# Patient Record
Sex: Male | Born: 1960 | Hispanic: Yes | Marital: Married
Health system: Southern US, Community
[De-identification: ages and names within clinical notes are randomized; demographics above are authoritative.]

---

## 2004-12-30 ENCOUNTER — Emergency Department: Payer: Self-pay | Admitting: Emergency Medicine

## 2006-08-13 ENCOUNTER — Ambulatory Visit: Payer: Self-pay | Admitting: Urology

## 2006-11-03 ENCOUNTER — Ambulatory Visit: Payer: Self-pay | Admitting: Urology

## 2007-10-06 ENCOUNTER — Ambulatory Visit: Payer: Self-pay | Admitting: Podiatry

## 2008-09-15 ENCOUNTER — Emergency Department: Payer: Self-pay | Admitting: Emergency Medicine

## 2008-09-29 ENCOUNTER — Emergency Department: Payer: Self-pay | Admitting: Emergency Medicine

## 2009-11-13 ENCOUNTER — Emergency Department: Payer: Self-pay | Admitting: Unknown Physician Specialty

## 2009-12-11 ENCOUNTER — Emergency Department: Payer: Self-pay | Admitting: Internal Medicine

## 2009-12-13 ENCOUNTER — Emergency Department: Payer: Self-pay | Admitting: Emergency Medicine

## 2011-07-17 ENCOUNTER — Ambulatory Visit: Payer: Self-pay | Admitting: Family Medicine

## 2012-07-10 LAB — COMPREHENSIVE METABOLIC PANEL
Albumin: 4 g/dL (ref 3.4–5.0)
Alkaline Phosphatase: 89 U/L (ref 50–136)
Anion Gap: 8 (ref 7–16)
BUN: 23 mg/dL — ABNORMAL HIGH (ref 7–18)
Bilirubin,Total: 0.8 mg/dL (ref 0.2–1.0)
Chloride: 101 mmol/L (ref 98–107)
Co2: 27 mmol/L (ref 21–32)
Creatinine: 1.01 mg/dL (ref 0.60–1.30)
EGFR (African American): >60
Glucose: 145 mg/dL — ABNORMAL HIGH (ref 65–99)
Potassium: 3.9 mmol/L (ref 3.5–5.1)

## 2012-07-10 LAB — CBC
HCT: 46 % (ref 40.0–52.0)
MCHC: 33.9 g/dL (ref 32.0–36.0)
MCV: 91 fL (ref 80–100)
Platelet: 362 10*3/uL (ref 150–440)
RBC: 5.03 10*6/uL (ref 4.40–5.90)
RDW: 13.9 % (ref 11.5–14.5)
WBC: 17.9 10*3/uL — ABNORMAL HIGH (ref 3.8–10.6)

## 2012-07-11 LAB — CBC WITH DIFFERENTIAL/PLATELET
Basophil #: 0 10*3/uL (ref 0.0–0.1)
Eosinophil %: 0.5 %
Lymphocyte #: 3.2 10*3/uL (ref 1.0–3.6)
Lymphocyte %: 20.3 %
MCH: 31.1 pg (ref 26.0–34.0)
MCHC: 34 g/dL (ref 32.0–36.0)
MCV: 91 fL (ref 80–100)
Monocyte #: 1.4 x10 3/mm — ABNORMAL HIGH (ref 0.2–1.0)
Neutrophil #: 11.1 10*3/uL — ABNORMAL HIGH (ref 1.4–6.5)
Platelet: 331 10*3/uL (ref 150–440)
RDW: 13.7 % (ref 11.5–14.5)
WBC: 15.9 10*3/uL — ABNORMAL HIGH (ref 3.8–10.6)

## 2012-07-12 ENCOUNTER — Inpatient Hospital Stay: Payer: Self-pay | Admitting: Internal Medicine

## 2012-07-13 LAB — BASIC METABOLIC PANEL
Anion Gap: 7 (ref 7–16)
Calcium, Total: 8.8 mg/dL (ref 8.5–10.1)
Co2: 27 mmol/L (ref 21–32)
Creatinine: 1.1 mg/dL (ref 0.60–1.30)
Glucose: 112 mg/dL — ABNORMAL HIGH (ref 65–99)
Potassium: 4 mmol/L (ref 3.5–5.1)
Sodium: 136 mmol/L (ref 136–145)

## 2012-07-13 LAB — CBC WITH DIFFERENTIAL/PLATELET
Eosinophil #: 0.2 10*3/uL (ref 0.0–0.7)
HCT: 43 % (ref 40.0–52.0)
HGB: 14.5 g/dL (ref 13.0–18.0)
MCH: 31.1 pg (ref 26.0–34.0)
MCHC: 33.8 g/dL (ref 32.0–36.0)
MCV: 92 fL (ref 80–100)
Monocyte %: 10.9 %
Neutrophil #: 6.2 10*3/uL (ref 1.4–6.5)
Neutrophil %: 55 %
RBC: 4.67 10*6/uL (ref 4.40–5.90)
RDW: 13.5 % (ref 11.5–14.5)
WBC: 11.2 10*3/uL — ABNORMAL HIGH (ref 3.8–10.6)

## 2012-07-13 LAB — VANCOMYCIN, TROUGH: Vancomycin, Trough: 8 ug/mL — ABNORMAL LOW (ref 10–20)

## 2012-07-14 LAB — CBC WITH DIFFERENTIAL/PLATELET
Basophil %: 0.3 %
Eosinophil %: 1.2 %
Lymphocyte #: 2.8 10*3/uL (ref 1.0–3.6)
Lymphocyte %: 24.7 %
MCH: 31 pg (ref 26.0–34.0)
MCHC: 33.7 g/dL (ref 32.0–36.0)
Monocyte %: 8 %
RBC: 4.39 10*6/uL — ABNORMAL LOW (ref 4.40–5.90)
RDW: 13.7 % (ref 11.5–14.5)
WBC: 11.3 10*3/uL — ABNORMAL HIGH (ref 3.8–10.6)

## 2012-07-14 LAB — VANCOMYCIN, TROUGH: Vancomycin, Trough: 10 ug/mL (ref 10–20)

## 2012-07-14 LAB — BASIC METABOLIC PANEL
Anion Gap: 6 — ABNORMAL LOW (ref 7–16)
Calcium, Total: 8.4 mg/dL — ABNORMAL LOW (ref 8.5–10.1)
Co2: 28 mmol/L (ref 21–32)
Creatinine: 0.99 mg/dL (ref 0.60–1.30)
EGFR (African American): >60
Osmolality: 275 (ref 275–301)
Potassium: 4 mmol/L (ref 3.5–5.1)

## 2012-07-15 LAB — CBC WITH DIFFERENTIAL/PLATELET
Basophil #: 0 10*3/uL (ref 0.0–0.1)
Basophil %: 0.3 %
Eosinophil #: 0.2 10*3/uL (ref 0.0–0.7)
Eosinophil %: 1.6 %
HCT: 41.5 % (ref 40.0–52.0)
HGB: 14.1 g/dL (ref 13.0–18.0)
MCHC: 33.9 g/dL (ref 32.0–36.0)
MCV: 93 fL (ref 80–100)
Monocyte #: 1.3 x10 3/mm — ABNORMAL HIGH (ref 0.2–1.0)
Monocyte %: 11.3 %
Neutrophil #: 6.4 10*3/uL (ref 1.4–6.5)
Neutrophil %: 56 %
RDW: 13.7 % (ref 11.5–14.5)

## 2012-07-16 LAB — CULTURE, BLOOD (SINGLE)

## 2014-07-27 NOTE — H&P (Signed)
   Subjective/Chief Complaint Right knee pain, swelling, erythema x 4 days   History of Present Illness [name] is a pleasant 54 yo M with a history of diabetes, HTN and hypercholesterolemia who presents with 4 days of worsening right knee pain/redness/erythema/crepitace.  His pain began suddenly last Thursday.  Cannot remember any specific trauma.  Began as a small area and then expanded and became more tender.  Subjective fevers/chills at admission.  Difficulty with extension of knee.  Otherwise doing well.   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[employer]   Review of Systems:  Subjective/Chief Complaint Right knee pain/redness/drainage   Fever/Chills Yes   Cough No   Sputum No   Abdominal Pain No   Diarrhea No   Constipation No   Nausea/Vomiting No   SOB/DOE No   Chest Pain No   Dysuria No   Physical Exam:  GEN well developed, well nourished, no acute distress   HEENT pink conjunctivae, PERRL, hearing intact to voice, moist oral mucosa   NECK supple  No masses   RESP normal resp effort  clear BS  no use of accessory muscles   CARD regular rate  no murmur  no thrills  No LE edema  no JVD   ABD denies tenderness  denies Flank Tenderness  no hernia  soft  normal BS  no Abdominal Bruits   LYMPH negative neck, negative axillae   EXTR negative cyanosis/clubbing, negative edema, + right leg erythema/induration/crepitance   SKIN No rashes, No ulcers   NEURO cranial nerves intact, negative rigidity, negative tremor, follows commands   PSYCH alert, A+O to time, place, person, good insight     Assessment/Admission Diagnosis [name] is a pleasant 54 yo M with Right knee cellulitis/induration and crepitace. Concern for severe necrotizing soft tissue infection.   Plan Will obtain CT leg to determine extent of subq air.  Will make NPO.  Will broaded with Zosyn for anaerobes/gram -.  Will plan on OR later tonight.   Electronic Signatures: Jarvis NewcomerLundquist, Javona Bergevin A (MD)  (Signed 07-Apr-14 15:25)  Authored: CHIEF COMPLAINT and HISTORY, PAST MEDICAL/SURGIAL HISTORY, ALLERGIES, FAMILY AND SOCIAL HISTORY, REVIEW OF SYSTEMS, PHYSICAL EXAM, ASSESSMENT AND PLAN   Last Updated: 07-Apr-14 15:25 by Jarvis NewcomerLundquist, Kirandeep Fariss A (MD)

## 2014-07-27 NOTE — Discharge Summary (Signed)
PATIENT NAME:  Edward Kirk, [place] M MR#:  [id number] DATE OF BIRTH:  [birth year]  DATE OF ADMISSION:  07/12/2012 DATE OF DISCHARGE:  07/15/2012  PRIMARY CARE PHYSICIAN:  [facility].  SURGEON:  Ida Roguehristopher Lundquist, M.D.   FINAL DIAGNOSES: 1.  Staph aureus abscess and cellulitis of the knee.  2.  Diabetes, diet controlled.  3.  Hypertension.  4.  Hyperlipidemia.   MEDICATIONS ON DISCHARGE: Aspirin 81 mg daily, lisinopril 20 mg daily, lovastatin 20 mg daily, Colace 100 mg twice a day, Cipro 500 mg every 12 hour for 10 more days, oxycodone 5 mg 1 tablet every 4 hours as needed for pain.  WOUND CARE:  Keep wound covered at all times and dry. Dry dressing daily. Follow up with Dr. Juliann PulseLundquist, surgery next week to remove the Penrose drain.   DIET: Low sodium, carbohydrate-controlled diet, regular consistency.   ACTIVITY: As tolerated.   FOLLOW UP:  Follow-up with [facility] April 23.   REASON FOR ADMISSION: The patient was admitted 07/10/2012 and discharged 07/15/2012. The patient was admitted with a right knee cellulitis that  started as a small spot and expanded with redness and tenderness, difficulty moving the leg.  He was admitted with cellulitis of the right knee and induration.  The patient was initially admitted as an observation and then started on IV vancomycin.  LABORATORY AND DIAGNOSTIC DAT DURING HOSPITAL COURSE: Included a white blood cell count is 17.9, hemoglobin and hematocrit 15.6 and 40.6, platelet count of 362, glucose 145, BUN 23, creatinine 1.01, sodium 136, potassium 3.9, chloride 101, CO2 27, calcium 9.0. Blood cultures negative. Ultrasound of the right lower extremity:  No deep vein thrombosis, prominent lymph nodes right groin.  CT tibia of the right leg showed subcutaneous edema. No abscess. CT femur of the thigh showed diffuse subcutaneous edema, no focal bony abnormality. April 9 wound culture grew out staph aureus resistant to clindamycin and  erythromycin but sensitive to everything else. White count upon discharge 11.4. The patient was taken to the operating room by Dr. Juliann PulseLundquist on 07/13/2012.  Please see operative report.  HOSPITAL COURSE PER PROBLEM LIST: 1.  For the patient's staph aureus abscess and cellulitis of the knee, the patient was initially started on vancomycin and Zosyn was added.  He was very slow to improve. The patient had an elevated white count and was taken to the operating room by Dr. Juliann PulseLundquist on 04/09. Penrose drain was placed. The patient was kept on the vancomycin up until discharge and was switched to Cipro on the day of discharge since the staph aureus is sensitive.  We will continue the Cipro for another 10 days upon discharge.  The patient was given a walker.  The patient was given out of work until seen by surgery as outpatient. He was given pain medication.  2.  Diabetes. I think this can be diet-controlled.  Sugars were borderline during the hospital course.  3.  Hypertension. Blood pressure is stable on lisinopril.  4.  Hyperlipidemia: The patient is on lovastatin, no changes in medications.  5.  For the patient's constipation, the patient did have a bowel movement here. I will prescribe Colace while the patient is on pain medication.  Time spent on discharge: 35 minutes.     ____________________________ Herschell Dimesichard J. Renae GlossWieting, MD rjw:ct D: 07/15/2012 17:53:05 ET T: 07/16/2012 13:43:00 ET JOB#: [id number]  cc: Herschell Dimesichard J. Renae GlossWieting, MD, <Dictator> Christopher A. Lundquist, MD [facility] Salley ScarletICHARD J Nicci Vaughan MD ELECTRONICALLY SIGNED 07/16/2012  19:23 

## 2014-07-27 NOTE — H&P (Signed)
PATIENT NAME:  Edward Kirk, Edward Kirk MR#:  [id number] DATE OF BIRTH:  1960/11/27  DATE OF ADMISSION:  07/11/2012  PRIMARY CARE PHYSICIAN: Dr. Sheppard Penton.   REFERRING PHYSICIAN: Si Raider.   CHIEF COMPLAINT: Right leg cellulitis.   HISTORY OF PRESENT ILLNESS: The patient is a 54 year old Hispanic male with history of diabetes mellitus, hypertension and hypercholesterolemia. He was in his usual state of health until about Thursday, that is 4 days ago, when he developed a small red spot on the right knee that with time expanded to have redness and swelling and tenderness. There is central ulcer on that spot. The patient denies having any trauma to this area or any form of bite. With time, the area expanded and got worse and tender. The patient reports a mild feverish feeling and some chills.   REVIEW OF SYSTEMS:   CONSTITUTIONAL: Mild feverish feeling and little chills. No fatigue.  EYES: No blurring of vision. No double vision.  ENT: No hearing impairment. No sore throat. No dysphagia.  CARDIOVASCULAR: No chest pain. No shortness of breath. No syncope.  RESPIRATORY: No cough. No sputum production. No shortness of breath.  GASTROINTESTINAL: No abdominal pain, no vomiting, no diarrhea.  GENITOURINARY: No dysuria. No frequency of urination.  MUSCULOSKELETAL: No joint pain other than the tenderness in the right knee. No muscular tenderness or pain or swelling other than the tenderness and the induration in the right leg.  INTEGUMENTARY: No skin rash or ulcers other than the erythema of the cellulitis and the central punctum.   NEUROLOGY: No focal weakness. No seizure activity. No headache.  PSYCHIATRY: Denies anxiety. No depression.  ENDOCRINE: No polyuria or polydipsia. No heat or cold intolerance.   PAST MEDICAL HISTORY: Systemic hypertension, diabetes mellitus type 2, hypercholesterolemia.   PAST SURGICAL HISTORY: None.   SOCIAL HABITS: He smokes cigarettes every now and then but not  daily. He drinks 1 beer a day. No liquor. No other drug abuse.   SOCIAL HISTORY: He is married, perhaps he is separated but he does not give confirmatory statement. At one time, he said separated. The other time he said no, he is still married. He works in Environmental manager.   FAMILY HISTORY: His parents both died of old age around 56 years old. No significant medical illness.   ADMISSION MEDICATIONS: He does not recall the name of his medications with the exception of lovastatin.   ALLERGIES: No known drug allergies.   PHYSICAL EXAMINATION:  VITAL SIGNS: Blood pressure 137/80, respiratory rate 18, pulse 110, temperature 98.6. His oxygen saturation is 96% on room air.  GENERAL APPEARANCE: He is a middle-aged male lying in bed in no acute distress.  HEAD AND NECK: No pallor. No icterus. No cyanosis. Ear examination revealed normal hearing, no discharge, no lesions. Examination of the nose showed no discharge, no bleeding, no ulcers. Examination of the oropharyngeal area showed no oral thrush, no exudates, no ulcers. Eye examination revealed normal eyelids and conjunctivae. Pupils are constricted. They are equal in size. Neck is supple. Trachea at midline. No thyromegaly. No cervical lymphadenopathy. No masses.  HEART: Normal S1, S2. No S3, S4. No murmur. No gallop. No carotid bruits.  RESPIRATORY: Normal breathing pattern without use of accessory muscles. No rales. No wheezing.  ABDOMEN: Soft without tenderness. No hepatosplenomegaly. No masses. No hernias.  SKIN: Indurated area with redness around the right knee joint measured 7 x 4 cm. There is extension of induration just above that area but without redness. He  is tender in that area. No calf tenderness or palpable cords.  MUSCULOSKELETAL: No joint swelling. No clubbing.  NEUROLOGIC: Cranial nerves II through XII were intact. No focal motor deficit.  PSYCHIATRIC: The patient is alert and oriented x3. Mood and affect were normal.    LABORATORY FINDINGS: CBC showed elevated white count reaching 17,900, hemoglobin 15, hematocrit 46, platelet count 362. Normal liver function tests and transaminases. Serum glucose 145, BUN 23, creatinine 1.01, sodium 136, potassium 3.9, calcium 9.   ASSESSMENT:  1. Cellulitis of the right knee with induration extending above that level.  2. Diabetes mellitus, type 2.  3. Systemic hypertension.  4. Hypercholesterolemia.   PLAN: Will admit the patient for observation. Blood cultures x2 were already taken in the Emergency Department, and he was started on IV vancomycin. I will continue same antibiotic. Will monitor the area of cellulitis and check the boundaries tomorrow. I will obtain Doppler ultrasound just to make sure there is no underlying DVT. Pain control. I will put him on Accu-Chek and sliding scale until he brings his medications from home or somebody will call for the names.   Time spent in evaluating this patient took more than 50 minutes.    ____________________________ Carney CornersAmir Kirk. Rudene Rearwish, MD amd:gb D: 07/11/2012 00:07:56 ET T: 07/11/2012 02:08:49 ET JOB#: [id number]  cc: Carney CornersAmir Kirk. Rudene Rearwish, MD, <Dictator> Zollie ScaleAMIR Kirk Mialani Reicks MD ELECTRONICALLY SIGNED 07/20/2012 5:06

## 2014-07-27 NOTE — Op Note (Signed)
PATIENT NAME:  Edward Kirk, Edward Kirk MR#:  [id number] DATE OF BIRTH:  1961/01/26  DATE OF PROCEDURE:  07/13/2012  PREOPERATIVE DIAGNOSIS: Right knee abscess.  POSTOPERATIVE DIAGNOSIS:  Right knee abscess.  PROCEDURE:  Incision and drainage of right knee abscess with placement of Penrose drain.   SURGEON:  Ida Roguehristopher Peter Daquila, MD  ESTIMATED BLOOD LOSS: 10 mL.  ANESTHESIA:  MAC local.  COMPLICATIONS:  None.  SPECIMEN: Culture swab from wound.   INDICATION FOR SURGERY: [name] is a pleasant 54 year old male with a history of right knee pain and swelling.  He had a CT which did not show any fluid collection, however, his wound came to a head and he was in need of incision and drainage of right knee abscess.   DETAILS OF PROCEDURE: Informed consent was obtained. He was brought to the operating room suite. He was laid supine on the operating room table. He was given sedation. His right knee was prepped and draped in standard surgical fashion.  A timeout was then performed correctly identifying the patient name, operative site and procedure to be performed.  An incision was made over the wound. It went deep. There was purulence that tracked up into his thigh, approximately 8 cm. I then opened up those areas and made sure there were not any loculations. I then made a counterincision on the thigh and placed a Penrose drain through the tract. I then irrigated the wound. It was hemostatic. I then sutured the Penrose drain at both entrance and exit sites with a 3-0 nylon suture. ABD pad and Kerlix gauze was then wrapped around the leg. The     patient was then awakened and brought to the postanesthesia care unit. There were no immediate complications. Needle, sponge, and instrument counts were correct at the end of the procedure.  ____________________________ Si Raiderhristopher A. Tristian Sickinger, MD cal:sb D: 07/13/2012 14:08:08 ET T: 07/13/2012 14:34:16 ET JOB#: [id number]  cc: Cristal Deerhristopher A.  Salihah Peckham, MD, <Dictator> Jarvis NewcomerHRISTOPHER A Harding Thomure MD ELECTRONICALLY SIGNED 07/13/2012 16:23

## 2014-10-01 IMAGING — CT CT OF THE RIGHT TIBIA FIBULA WITHOUT CONTRAST
1 series · 12 of 14 positions shown, 15 images · non-contrast
Comparison: none

REASON FOR EXAM: right knee crepitice
COMMENTS:

[Series 3: axial · axial · 0.36mm/px · z∈[-332,+32]mm · 12 of 143 slices shown, 15 images]
[im 11/143  soft-tissue]
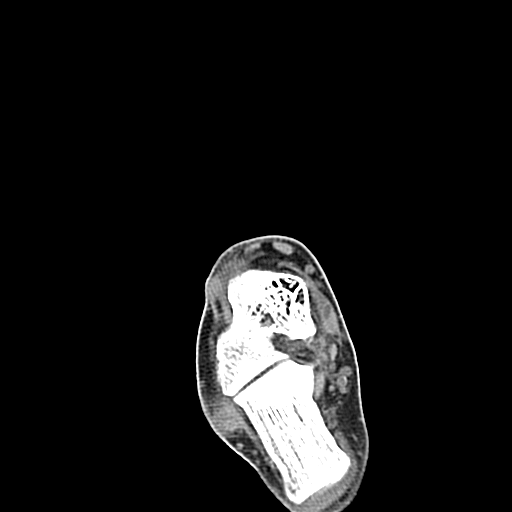
[im 11/143  bone]
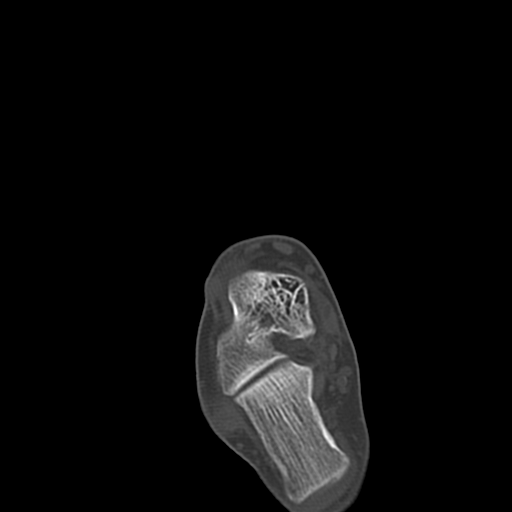
[im 22/143  bone]
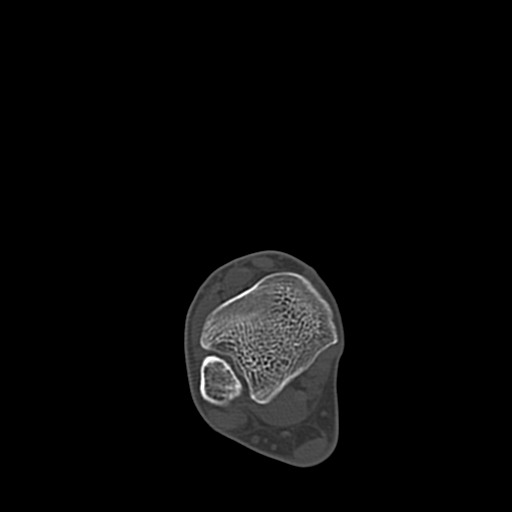
[im 33/143  bone]
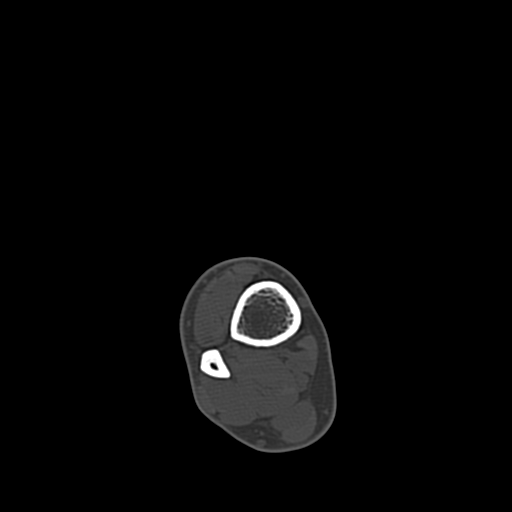
[im 44/143  bone]
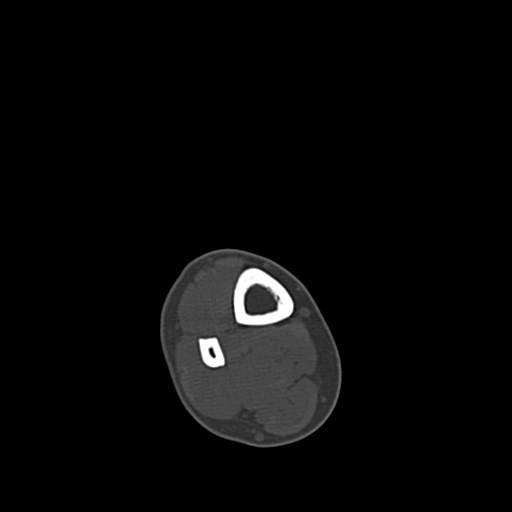
[im 55/143  soft-tissue]
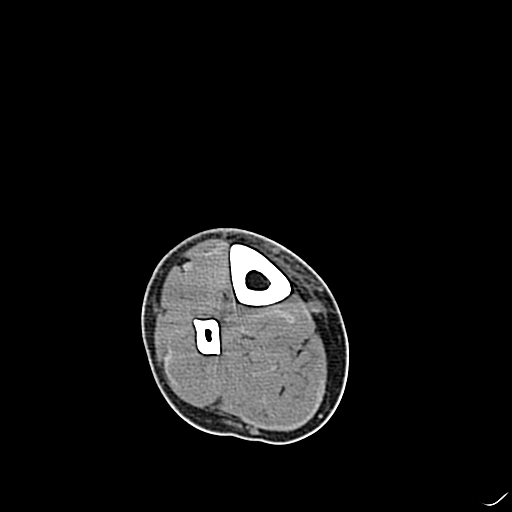
[im 55/143  bone]
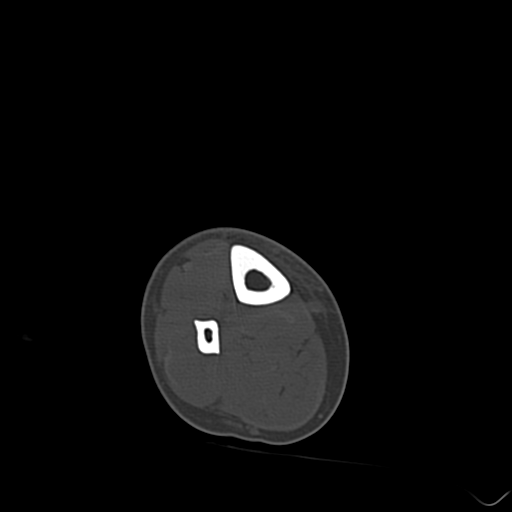
[im 66/143  bone]
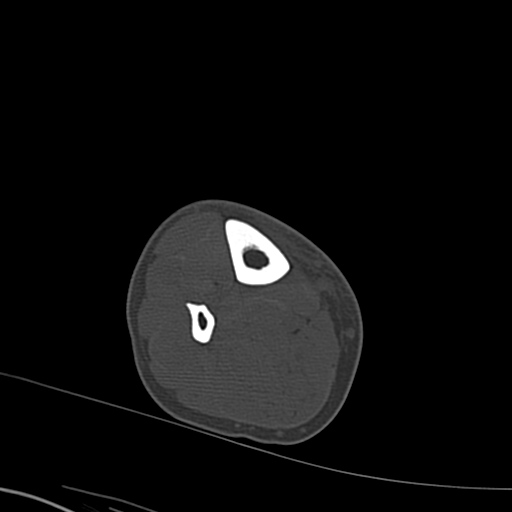
[im 77/143  bone]
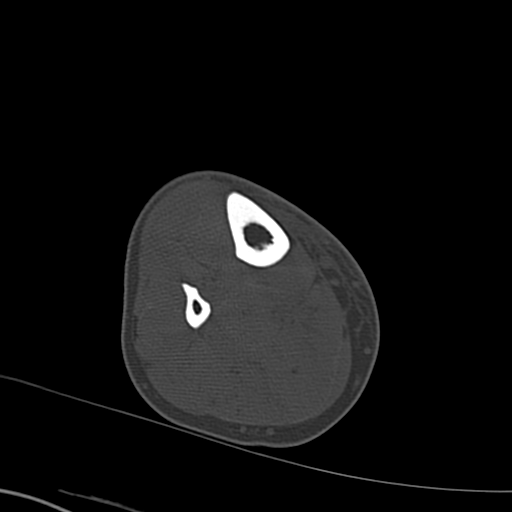
[im 88/143  bone]
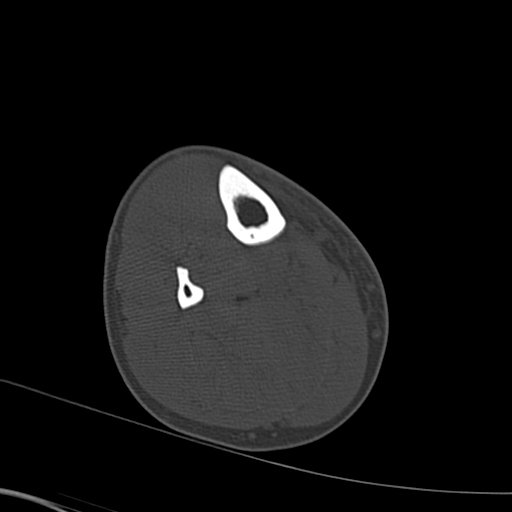
[im 99/143  soft-tissue]
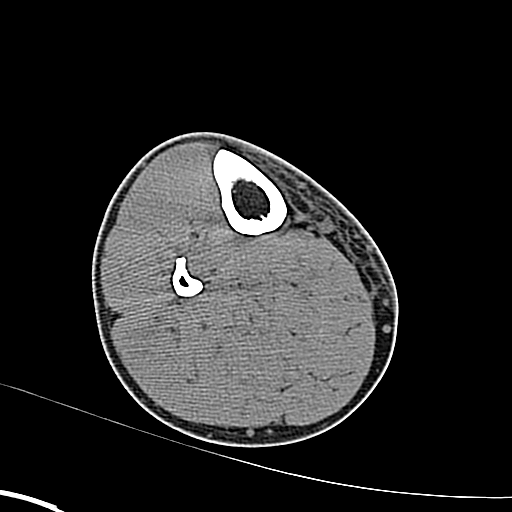
[im 99/143  bone]
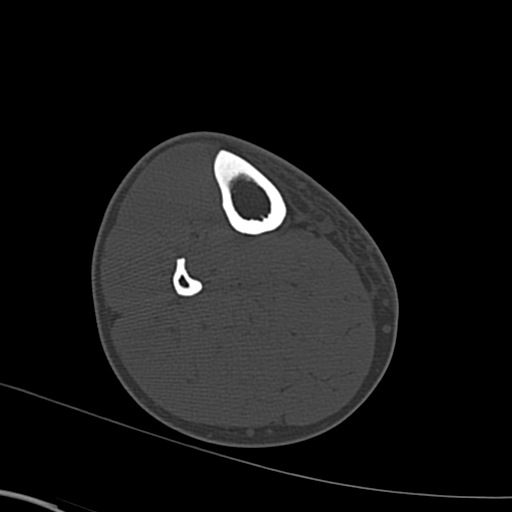
[im 110/143  bone]
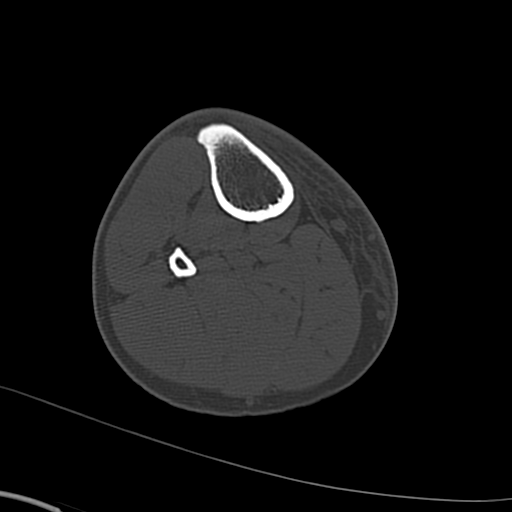
[im 121/143  bone]
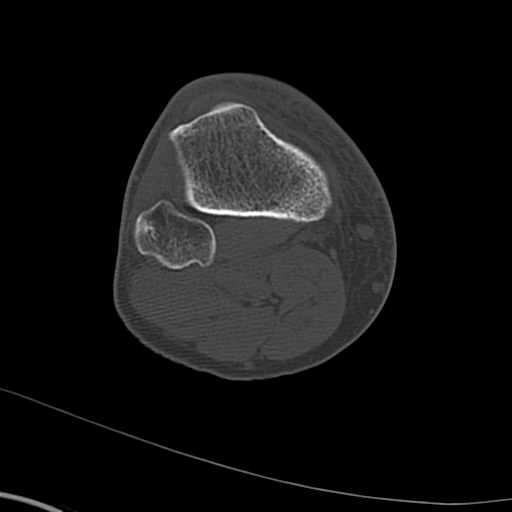
[im 132/143  bone]
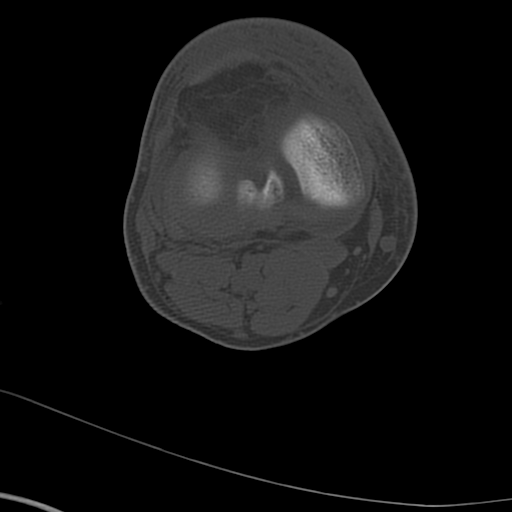

[12 of 14 positions shown; findings below may reference images not displayed]

PROCEDURE:     CT  - CT TIBIA / LOWER RIGHT WO  - July 11, 2012  [DATE]

RESULT:     History: Swelling. Standard CT of the a right tib-fib is
obtained. Misregistration artifact is noted. No acute bony abnormalities
identified. If the patient's had prior trauma tib-fib series can be
obtained. Subcutaneous edema noted about the anterior aspect of the a
tib-fib particular about the patella. No evidence of abscess. Muscular
structures appear to be intact.
IMPRESSION: Subcutaneous edema noted anteriorly. No abscess noted.
